# Patient Record
Sex: Male | Born: 1995 | Race: White | Hispanic: No | Marital: Single | Smoking: Never smoker
Health system: Southern US, Community
[De-identification: ages and names within clinical notes are randomized; demographics above are authoritative.]

---
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
## 2016-03-03 IMAGING — CR DG HIP COMPLETE 2+V*L*
1 series · 3 of 3 positions shown · non-contrast
Comparison: None.

CLINICAL DATA: Left hip pain for 4 weeks.  No injury.

EXAM:
LEFT HIP - COMPLETE 2+ VIEW

[Series 1: ap · 0.17mm/px · 3 of 3 slices shown]
[im 1/3]
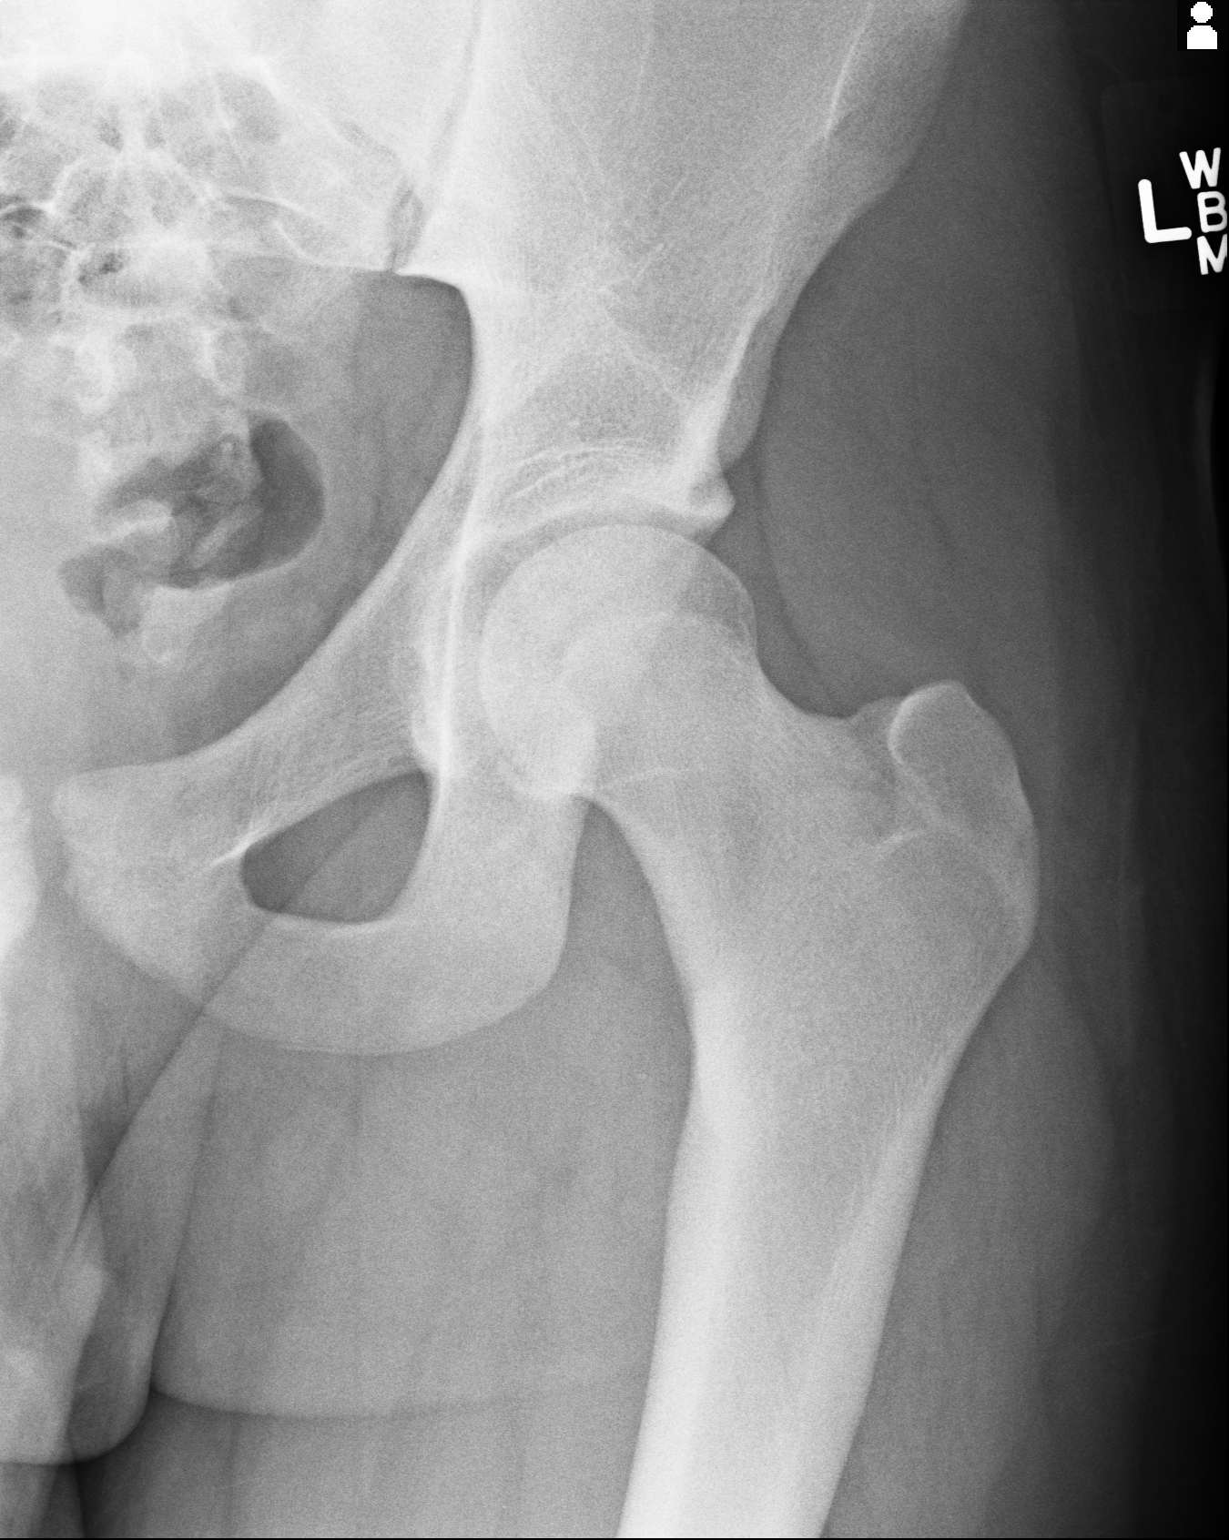
[im 2/3]
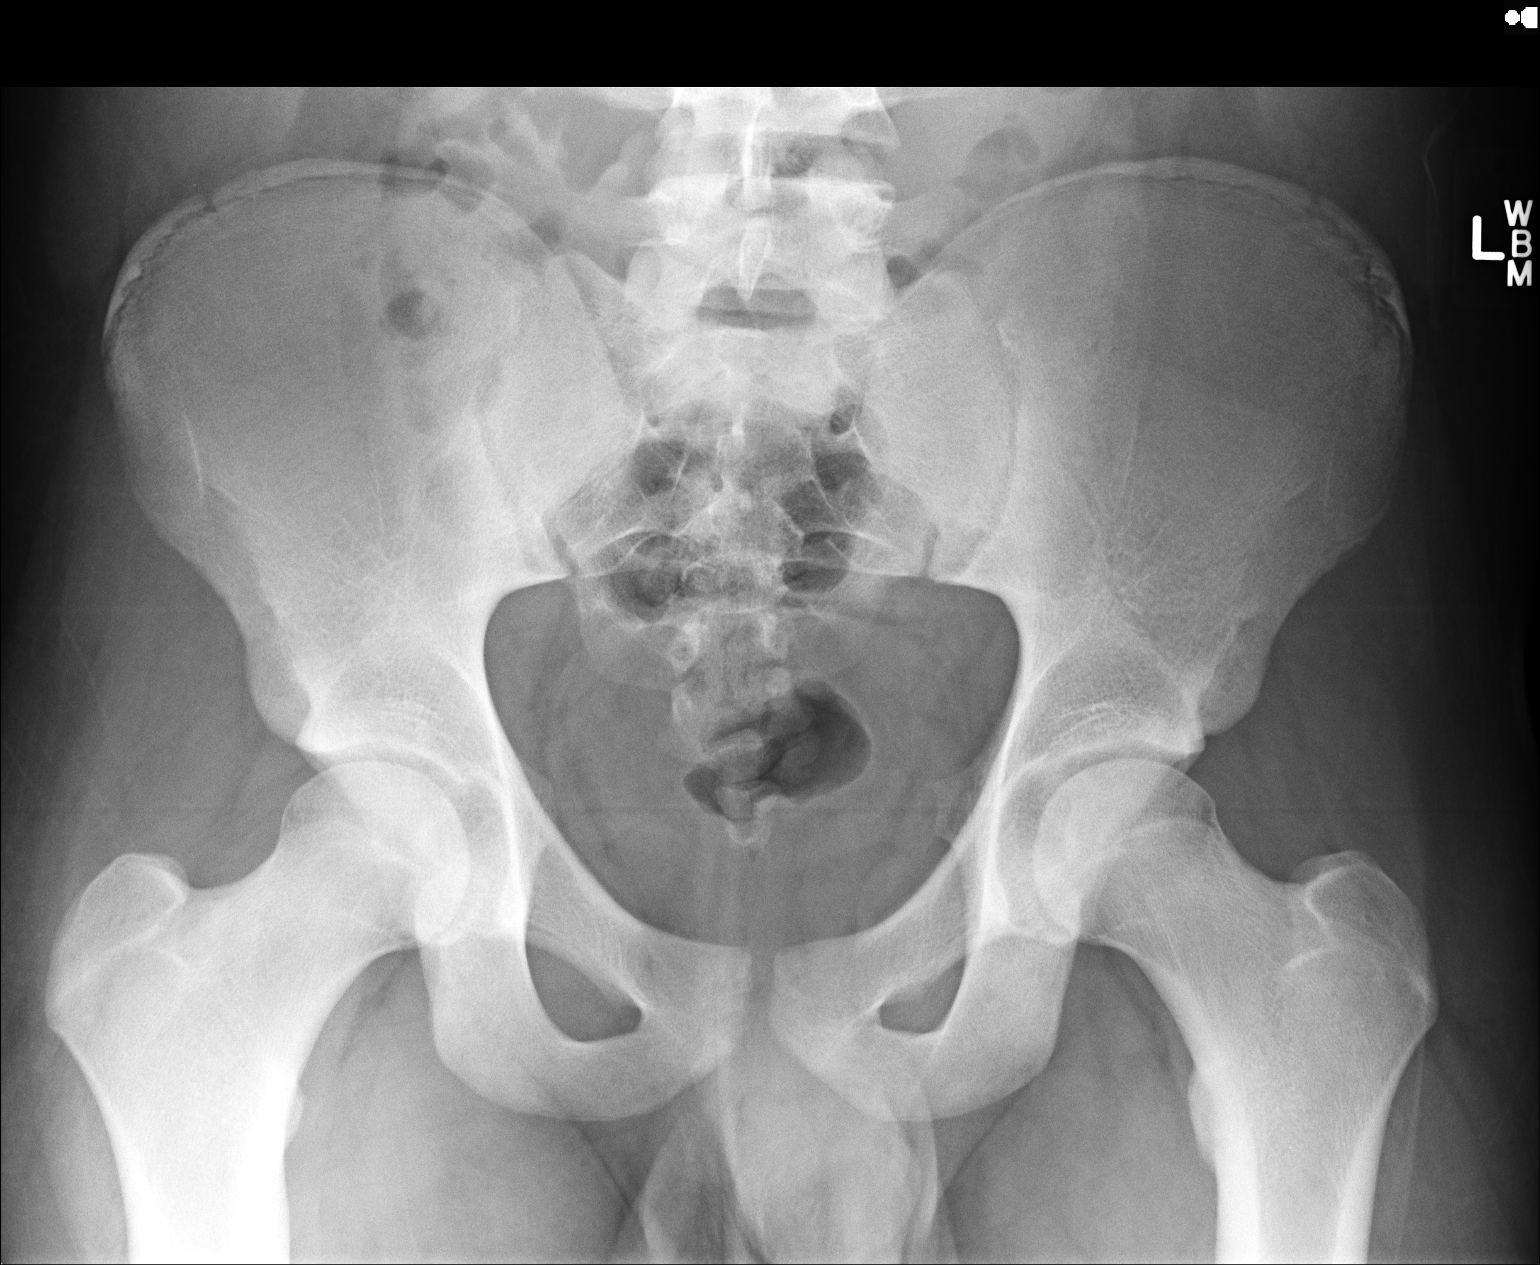
[im 3/3]
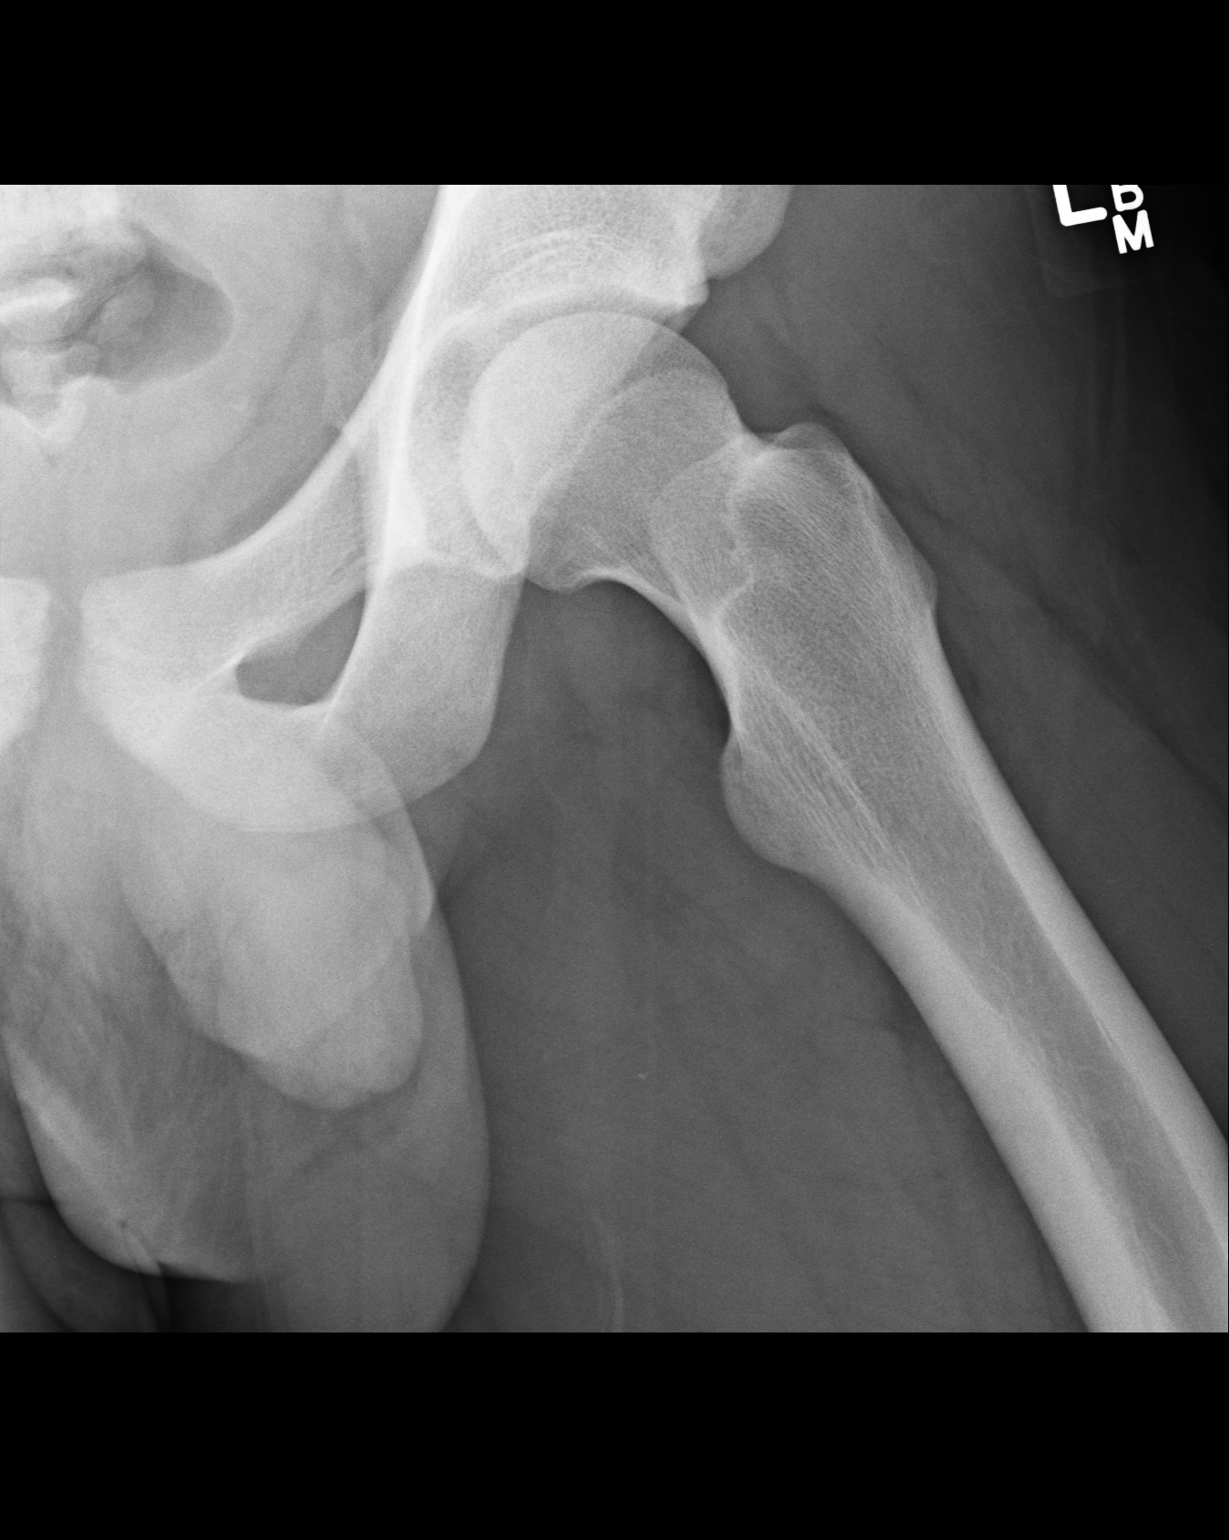

[3 of 3 positions shown; findings below may reference images not displayed]

FINDINGS: There is no evidence of hip fracture or dislocation. There is no
evidence of arthropathy or other focal bone abnormality.
IMPRESSION: Negative.

## 2018-03-21 ENCOUNTER — Emergency Department (HOSPITAL_COMMUNITY)
Admission: EM | Admit: 2018-03-21 | Discharge: 2018-03-21 | Disposition: A | Discharge status: Home / Self Care | Payer: Worker's Compensation | Attending: Emergency Medicine | Admitting: Emergency Medicine

## 2018-03-21 DIAGNOSIS — Y9389 Activity, other specified: Secondary | ICD-10-CM | POA: Diagnosis not present

## 2018-03-21 DIAGNOSIS — Y929 Unspecified place or not applicable: Secondary | ICD-10-CM | POA: Diagnosis not present

## 2018-03-21 DIAGNOSIS — W3189XA Contact with other specified machinery, initial encounter: Secondary | ICD-10-CM | POA: Insufficient documentation

## 2018-03-21 DIAGNOSIS — S51811A Laceration without foreign body of right forearm, initial encounter: Secondary | ICD-10-CM | POA: Diagnosis not present

## 2018-03-21 DIAGNOSIS — S41111A Laceration without foreign body of right upper arm, initial encounter: Secondary | ICD-10-CM | POA: Diagnosis present

## 2018-03-21 DIAGNOSIS — Z23 Encounter for immunization: Secondary | ICD-10-CM | POA: Diagnosis not present

## 2018-03-21 DIAGNOSIS — Y990 Civilian activity done for income or pay: Secondary | ICD-10-CM | POA: Diagnosis not present

## 2018-03-21 MED ORDER — TETANUS-DIPHTH-ACELL PERTUSSIS 5-2.5-18.5 LF-MCG/0.5 IM SUSP
0.5000 mL | Freq: Once | INTRAMUSCULAR | Status: AC
  Administered 2018-03-21: 0.5 mL via INTRAMUSCULAR
  Filled 2018-03-21: qty 0.5

## 2018-03-21 MED ORDER — CEPHALEXIN 500 MG PO CAPS: 500.0000 mg | ORAL_CAPSULE | Freq: Two times a day (BID) | ORAL | 0 refills | Status: AC

## 2018-03-21 MED ORDER — LIDOCAINE-EPINEPHRINE (PF) 2 %-1:200000 IJ SOLN
20.0000 mL | Freq: Once | INTRAMUSCULAR | Status: AC
  Administered 2018-03-21: 20 mL
  Filled 2018-03-21: qty 20

## 2018-03-21 MED ORDER — CEPHALEXIN 250 MG PO CAPS: 500.0000 mg | ORAL_CAPSULE | Freq: Once | ORAL | Status: DC

## 2018-03-21 NOTE — Discharge Instructions (Signed)
Please read attached information. If you experience any new or worsening signs or symptoms please return to the emergency room for evaluation. Please follow-up with your primary care provider or specialist as discussed. Please use medication prescribed only as directed and discontinue taking if you have any concerning signs or symptoms.   °

## 2018-03-21 NOTE — ED Notes (Signed)
Patient verbalizes understanding of discharge instructions. Opportunity for questioning and answers were provided. Armband removed by staff, pt discharged from ED ambulatory.   

## 2018-03-21 NOTE — ED Notes (Signed)
Suture cart and supplies set up at bedside.

## 2018-03-21 NOTE — ED Triage Notes (Signed)
Pt to ER for laceration to right anterior forearm. Bleeding controlled. Unknown last tetanus. Cut on an exhaust.

## 2018-03-21 NOTE — ED Provider Notes (Signed)
[facility] Provider Note   CSN: [id number] Arrival date & time: 03/21/18  1648     History   Chief Complaint Chief Complaint  Patient presents with  . Extremity Laceration    HPI Steve Donaldson is a 22 y.o. male.  HPI  22 year old male presents today with laceration to his right upper extremity.  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  He notes he cut his right upper extremity on a muffler.  Bleeding was controlled with direct pressure.  He denies any loss of distal sensation strength and motor function to his hands fingers or wrist.  Patient is uncertain of his last tetanus shot.  No significant medical history, no allergies or medications prior to arrival.  No past medical history on file.  Patient Active Problem List   Diagnosis Date Noted  . Paronychia 03/31/2015    No past surgical history on file.      Home Medications    Prior to Admission medications   Medication Sig Start Date End Date Taking? Authorizing Provider  cephALEXin (KEFLEX) 500 MG capsule Take 1 capsule (500 mg total) by mouth 2 (two) times daily. 03/21/18   Steve Donaldson, Tinnie GensJeffrey, PA-C  ibuprofen (ADVIL,MOTRIN) 800 MG tablet  09/29/14   [provider]  sulfamethoxazole-trimethoprim (BACTRIM DS,SEPTRA DS) 800-160 MG per tablet  10/12/14   [provider]    Family History No family history on file.  Social History Social History   Tobacco Use  . Smoking status: Never Smoker  Substance Use Topics  . Alcohol use: No    Alcohol/week: 0.0 oz  . Drug use: Not on file     Allergies   Patient has no known allergies.   Review of Systems Review of Systems   Physical Exam Updated Vital Signs BP (!) 153/93 (BP Location: Left Arm)   Pulse 72   Temp 98.9 F (37.2 C) (Oral)   Resp 18   SpO2 100%   Physical Exam   ED Treatments / Results  Labs (all labs ordered are listed, but only abnormal results are  displayed) Labs Reviewed - No data to display  EKG None  Radiology No results found.  Procedures .Marland Kitchen.Laceration Repair Date/Time: 03/21/2018 8:26 PM Performed by: Eyvonne MechanicHedges, Steve Ostrand, PA-C Authorized by: Eyvonne MechanicHedges, Steve Bolz, PA-C   Consent:    Consent obtained:  Verbal   Consent given by:  Patient   Risks discussed:  Infection, need for additional repair, pain, poor cosmetic result, retained foreign body and vascular damage   Alternatives discussed:  No treatment Anesthesia (see MAR for exact dosages):    Anesthesia method:  Local infiltration   Local anesthetic:  Lidocaine 2% WITH epi Laceration details:    Location: right upper extremity    Length (cm):  4 Repair type:    Repair type:  Simple Pre-procedure details:    Preparation:  Patient was prepped and draped in usual sterile fashion Exploration:    Hemostasis achieved with:  Direct pressure   Wound exploration: wound explored through full range of motion and entire depth of wound probed and visualized     Wound extent: no fascia violation noted, no foreign bodies/material noted, no muscle damage noted, no nerve damage noted, no tendon damage noted, no underlying fracture noted and no vascular damage noted     Contaminated: no   Treatment:    Area cleansed with:  Betadine and saline   Amount of cleaning:  Extensive   Irrigation  solution:  Sterile water and sterile saline   Irrigation volume:  1 liter    Visualized foreign bodies/material removed: no   Skin repair:    Repair method:  Sutures   Suture size:  3-0   Suture material:  Prolene   Suture technique:  Simple interrupted   Number of sutures:  8 Approximation:    Approximation:  Close Post-procedure details:    Dressing:  Antibiotic ointment   Patient tolerance of procedure:  Tolerated well, no immediate complications   (including critical care time)  Medications Ordered in ED Medications  cephALEXin (KEFLEX) capsule 500 mg (has no administration in time range)   lidocaine-EPINEPHrine (XYLOCAINE W/EPI) 2 %-1:200000 (PF) injection 20 mL (20 mLs Infiltration Given by Other 03/21/18 1907)  Tdap (BOOSTRIX) injection 0.5 mL (0.5 mLs Intramuscular Given 03/21/18 1907)     Initial Impression / Assessment and Plan / ED Course  I have reviewed the triage vital signs and the nursing notes.  Pertinent labs & imaging results that were available during my care of the patient were reviewed by me and considered in my medical decision making (see chart for details).     Final Clinical Impressions(s) / ED Diagnoses   Final diagnoses:  Laceration of right forearm, initial encounter    Labs:   Imaging:  Consults:  Therapeutics: tdap lidocaine, keflex  Discharge Meds: keflex   Assessment/Plan: 22 year old male presents today with laceration to his right upper extremity.  He has no signs of major vascular or muscle involvement.  Wound was thoroughly irrigated and closed here.  Patient placed on 3 days of prophylactic antibiotics.  Patient will return in 7-10 days for suture removal sooner as needed for any new or worsening signs or symptoms.  Patient verbalized understanding and agreement to this point had no further questions or concerns from discharge.      ED Discharge Orders        Ordered    cephALEXin (KEFLEX) 500 MG capsule  2 times daily     03/21/18 2023       Eyvonne Mechanic, Cordelia Poche 03/21/18 2027    Mancel Bale, MD 03/22/18 1511

## 2018-05-23 ENCOUNTER — Other Ambulatory Visit: Payer: Self-pay

## 2018-05-23 ENCOUNTER — Emergency Department (HOSPITAL_COMMUNITY): Payer: BLUE CROSS/BLUE SHIELD

## 2018-05-23 ENCOUNTER — Emergency Department (HOSPITAL_COMMUNITY)
Admission: EM | Admit: 2018-05-23 | Discharge: 2018-05-23 | Disposition: A | Discharge status: Home / Self Care | Payer: BLUE CROSS/BLUE SHIELD | Attending: Emergency Medicine | Admitting: Emergency Medicine

## 2018-05-23 ENCOUNTER — Encounter (HOSPITAL_COMMUNITY): Payer: Self-pay

## 2018-05-23 DIAGNOSIS — Y92310 Basketball court as the place of occurrence of the external cause: Secondary | ICD-10-CM | POA: Insufficient documentation

## 2018-05-23 DIAGNOSIS — Y999 Unspecified external cause status: Secondary | ICD-10-CM | POA: Diagnosis not present

## 2018-05-23 DIAGNOSIS — Y9367 Activity, basketball: Secondary | ICD-10-CM | POA: Diagnosis not present

## 2018-05-23 DIAGNOSIS — S93401A Sprain of unspecified ligament of right ankle, initial encounter: Secondary | ICD-10-CM | POA: Insufficient documentation

## 2018-05-23 DIAGNOSIS — X500XXA Overexertion from strenuous movement or load, initial encounter: Secondary | ICD-10-CM | POA: Insufficient documentation

## 2018-05-23 DIAGNOSIS — S99911A Unspecified injury of right ankle, initial encounter: Secondary | ICD-10-CM | POA: Diagnosis present

## 2018-05-23 NOTE — ED Notes (Signed)
ED Provider at bedside. 

## 2018-05-23 NOTE — ED Triage Notes (Addendum)
Patient states that he injured his right ankle while playing basketball.  Rolled my ankle and it started swelling.  Patient has iced his ankle.  Took 400 mg of Ibuprofen prior to arrival here.  Was not able to bear weight.

## 2018-05-23 NOTE — ED Provider Notes (Signed)
[facility] EMERGENCY DEPARTMENT Provider Note   CSN: [id number] Arrival date & time: 05/23/18  2215     History   Chief Complaint Chief Complaint  Patient presents with  . Ankle Pain    HPI Steve Donaldson is a 22 y.o. male.  HPI   Steve Donaldson is a 22 y.o. male who presents to the Emergency Department complaining of right ankle pain and swelling.  He was playing basketball this evening when he "rolled" his ankle. Reports immediate swelling and felt a "pop" to his outer ankle.  Unable to bear weight.  Pain worse with movement, improves at rest and with application of ice.  Took 800 mg ibuprofen just prior to arrival.  Denies numbness of his foot, calf or knee pain or other injuries.   History reviewed. No pertinent past medical history.  Patient Active Problem List   Diagnosis Date Noted  . Paronychia 03/31/2015    History reviewed. No pertinent surgical history.    Home Medications    Prior to Admission medications   Medication Sig Start Date End Date Taking? Authorizing Provider  cephALEXin (KEFLEX) 500 MG capsule Take 1 capsule (500 mg total) by mouth 2 (two) times daily. 03/21/18   Hedges, Tinnie GensJeffrey, PA-C  ibuprofen (ADVIL,MOTRIN) 800 MG tablet  09/29/14   [provider]  sulfamethoxazole-trimethoprim (BACTRIM DS,SEPTRA DS) 800-160 MG per tablet  10/12/14   [provider]    Family History History reviewed. No pertinent family history.  Social History Social History   Tobacco Use  . Smoking status: Never Smoker  . Smokeless tobacco: Never Used  Substance Use Topics  . Alcohol use: No    Alcohol/week: 0.0 oz  . Drug use: Never     Allergies   Patient has no known allergies.   Review of Systems Review of Systems  Constitutional: Negative for chills and fever.  Musculoskeletal: Positive for arthralgias (right ankle pain and swelling) and joint swelling.  Skin: Negative for color change and wound.  Neurological: Negative for  weakness and numbness.  All other systems reviewed and are negative.    Physical Exam Updated Vital Signs BP (!) 154/104 (BP Location: Right Arm)   Pulse (!) 101   Temp 98.9 F (37.2 C) (Oral)   Resp 16   Ht 6\' 1"  (1.854 m)   Wt 113.4 kg (250 lb)   SpO2 99%   BMI 32.98 kg/m   Physical Exam  Constitutional: He appears well-developed and well-nourished. No distress.  HENT:  Head: Atraumatic.  Cardiovascular: Normal rate, regular rhythm and intact distal pulses.  Pulmonary/Chest: Effort normal and breath sounds normal. No respiratory distress.  Musculoskeletal: He exhibits edema and tenderness. He exhibits no deformity.  Moderate edema of the lateral right ankle. Pain reproduced with inversion of the ankle.  No bony deformity noted.  No proximal tenderness or edema.  No open wounds.   Neurological: He is alert. No sensory deficit. He exhibits normal muscle tone. Coordination normal.  Skin: Skin is warm and dry. Capillary refill takes less than 2 seconds.  Nursing note and vitals reviewed.    ED Treatments / Results  Labs (all labs ordered are listed, but only abnormal results are displayed) Labs Reviewed - No data to display  EKG None  Radiology Dg Ankle Complete Right  Result Date: 05/23/2018 CLINICAL DATA:  Basketball injury.  Pain and swelling. EXAM: RIGHT ANKLE - COMPLETE 3+ VIEW COMPARISON:  None. FINDINGS: There is no evidence of acute fracture joint dislocation.  There is no evidence of arthropathy or other focal bone abnormality. Marked soft tissue swelling is seen about the malleoli more so laterally. No widening of the medial clear space of the ankle. Mild soft tissue induration is seen dorsal to the ankle joint. Small joint effusion is noted. Well corticated rounded ossific density is seen across the talonavicular joint likely related to old remote injury. The base of the fifth metatarsal appears intact. IMPRESSION: Soft tissue swelling about the malleoli more so  laterally. Small ankle joint effusion. No acute osseous abnormality is identified. Electronically Signed   By: Tollie Eth M.D.   On: 05/23/2018 22:58    Procedures Procedures (including critical care time)  Medications Ordered in ED Medications - No data to display   Initial Impression / Assessment and Plan / ED Course  I have reviewed the triage vital signs and the nursing notes.  Pertinent labs & imaging results that were available during my care of the patient were reviewed by me and considered in my medical decision making (see chart for details).     XR neg for fx.  Discussed findings with patient.   NV intact.  Likely sprain.  ASO applied and pt given crutches.  He agrees to RICE therapy and close orthopedic f/u in one week if not improving.  Ibuprofen for pain  Final Clinical Impressions(s) / ED Diagnoses   Final diagnoses:  Sprain of right ankle, unspecified ligament, initial encounter    ED Discharge Orders    None       Rosey Bath 05/23/18 2314    Linwood Dibbles, MD 05/23/18 2351

## 2018-05-23 NOTE — Discharge Instructions (Addendum)
Elevate and apply ice packs on and off to your ankle.  Use crutches for weightbearing for 1 week.  Follow-up with the orthopedic provider listed in 1 week if your symptoms are not improving.  Ibuprofen 600 to 800 mg every 6-8 hours, take with food.

## 2019-02-20 IMAGING — DX DG ANKLE COMPLETE 3+V*R*
3 series · 3 of 3 positions shown · non-contrast
Comparison: None.

CLINICAL DATA: Basketball injury.  Pain and swelling.

EXAM:
RIGHT ANKLE - COMPLETE 3+ VIEW

[ankle ap]
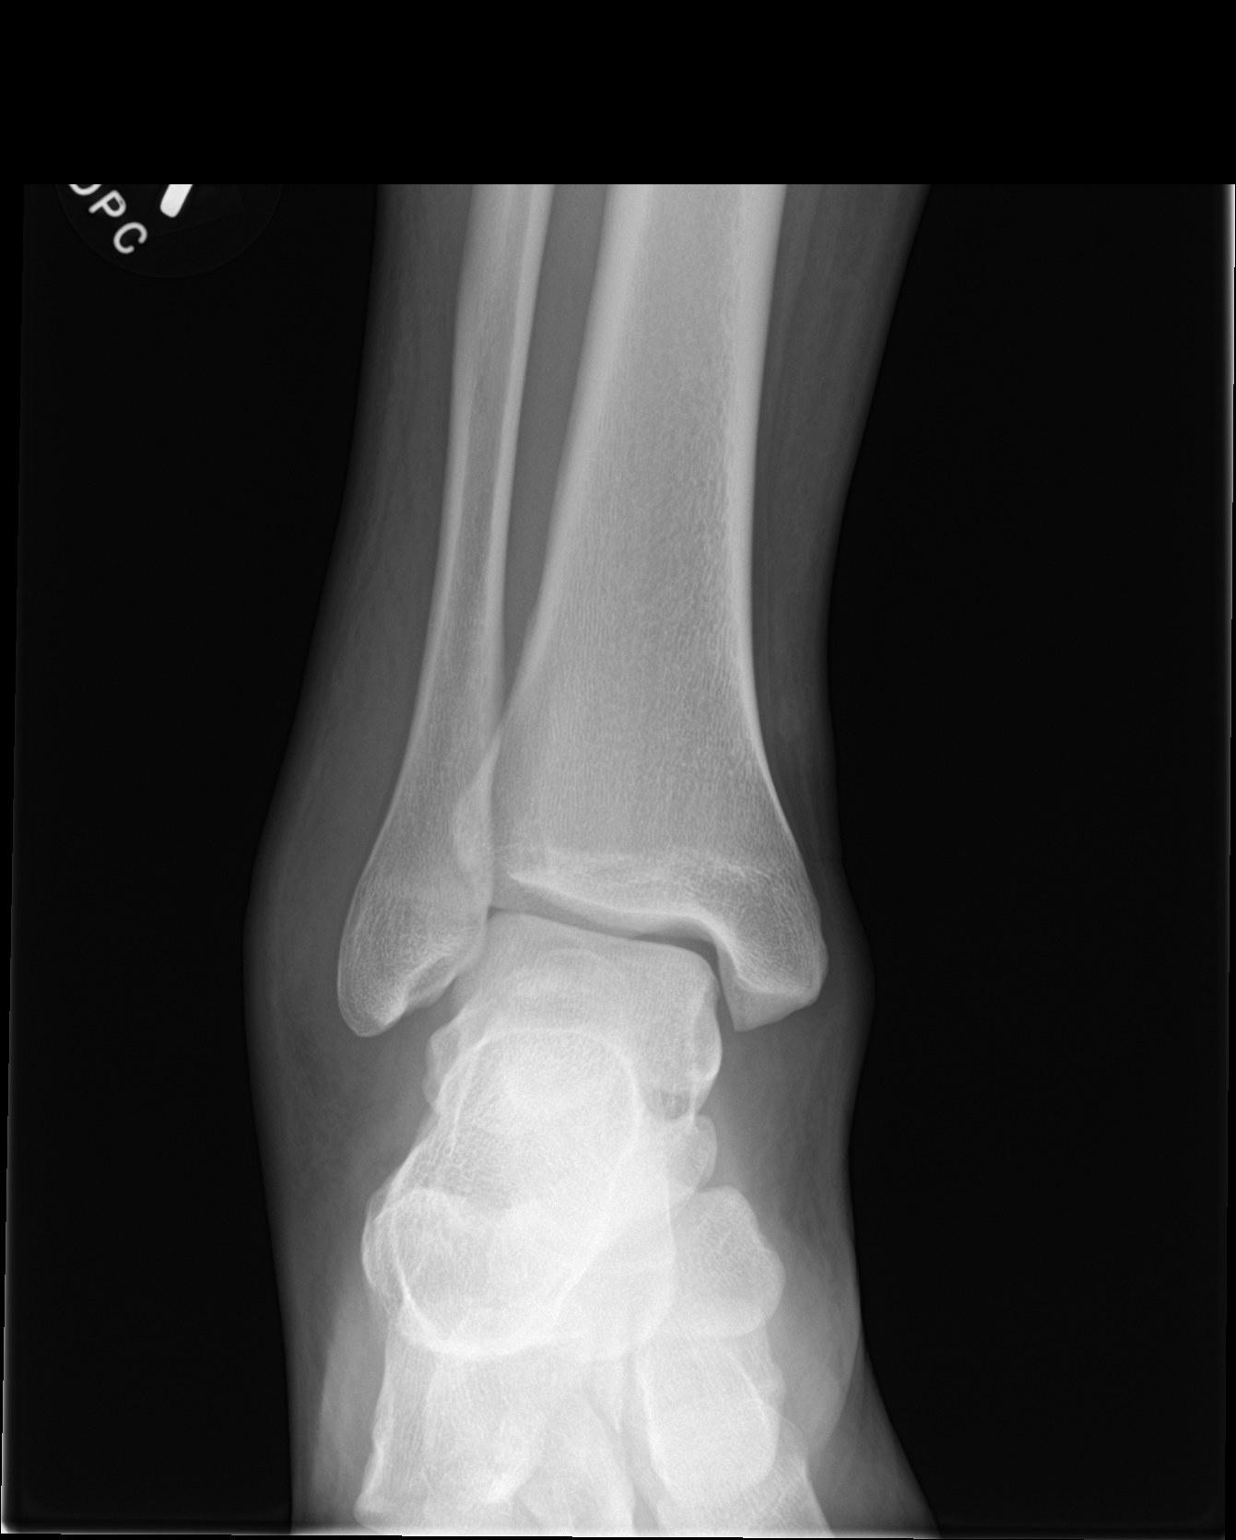

[ankle obl]
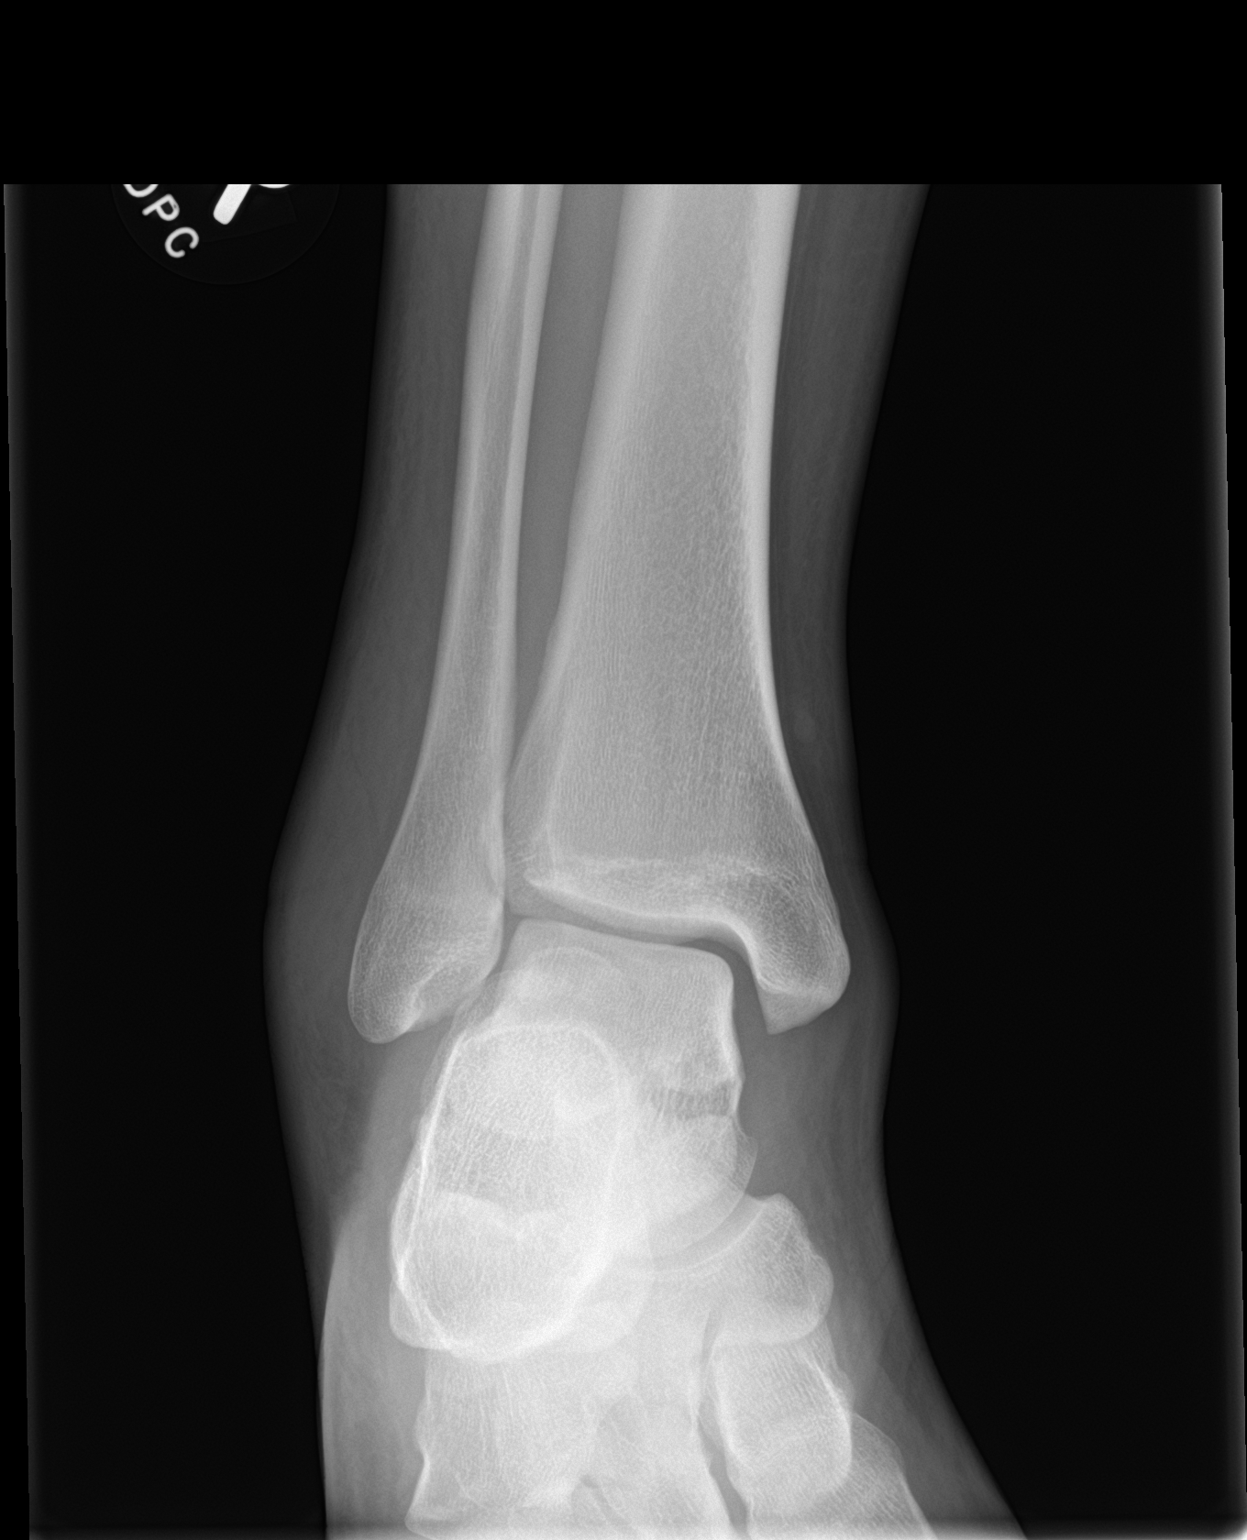

[ankle lat]
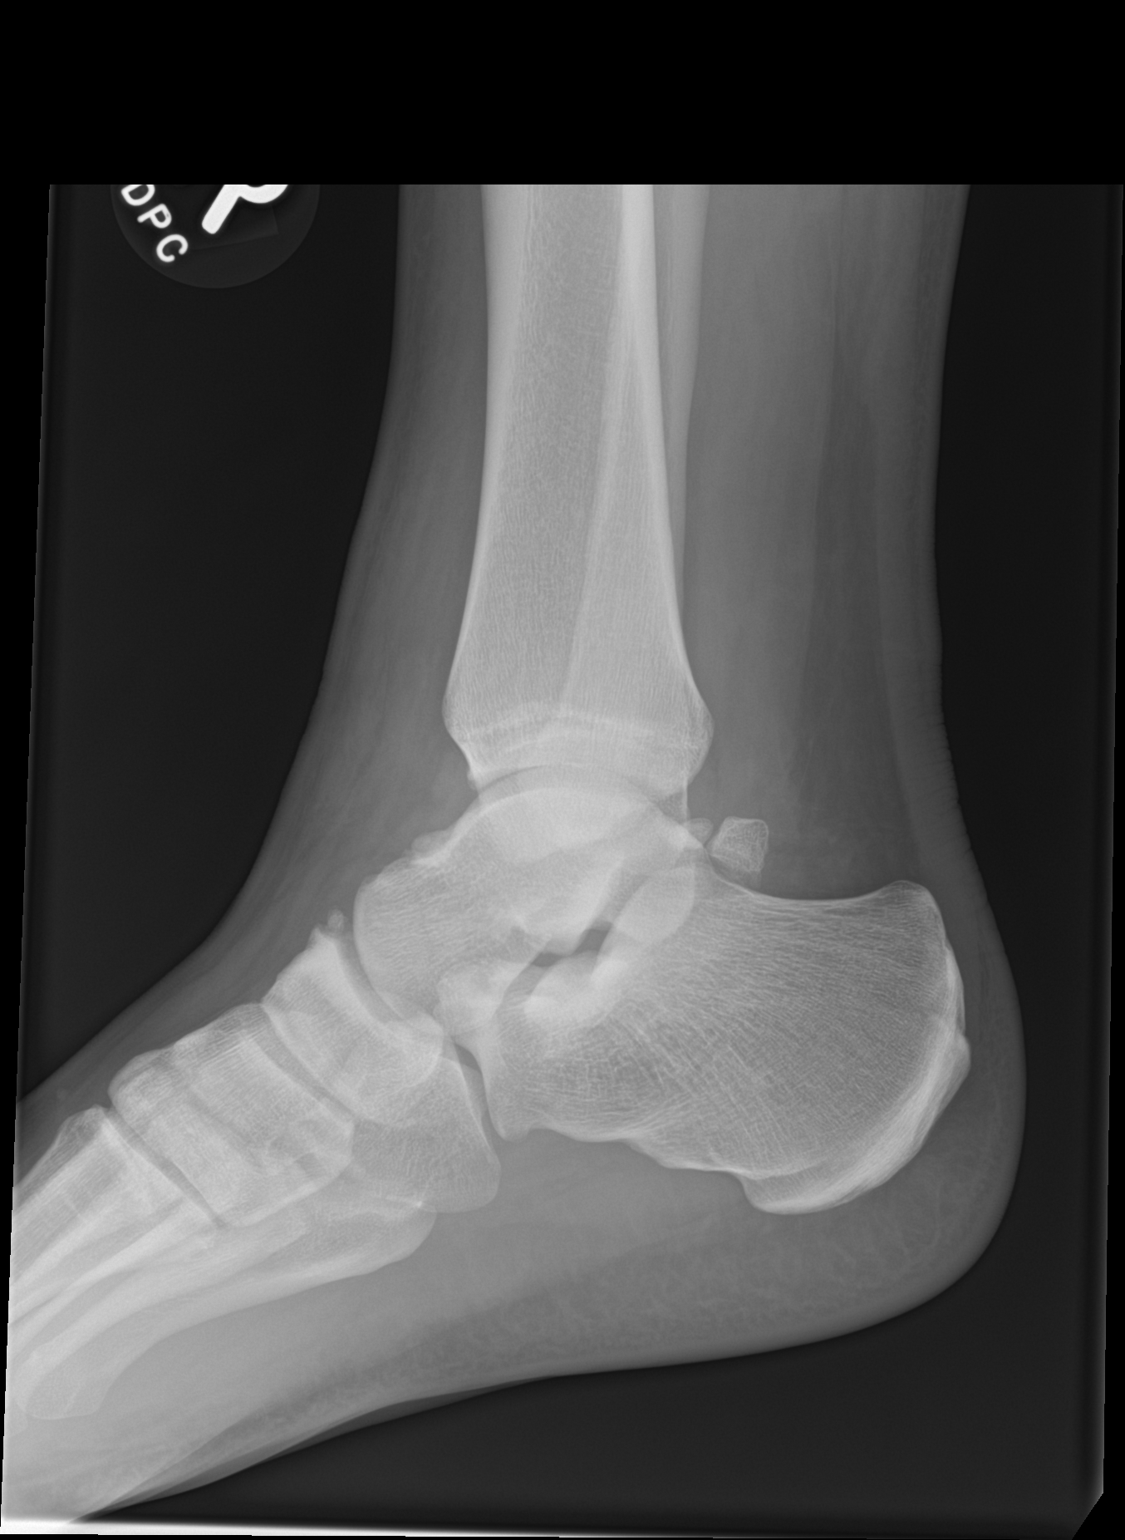

[3 of 3 positions shown; findings below may reference images not displayed]

FINDINGS: There is no evidence of acute fracture joint dislocation. There is
no evidence of arthropathy or other focal bone abnormality. Marked
soft tissue swelling is seen about the malleoli more so laterally.
No widening of the medial clear space of the ankle. Mild soft tissue
induration is seen dorsal to the ankle joint. Small joint effusion
is noted. Well corticated rounded ossific density is seen across the
talonavicular joint likely related to old remote injury. The base of
the fifth metatarsal appears intact.
IMPRESSION: Soft tissue swelling about the malleoli more so laterally. Small
ankle joint effusion. No acute osseous abnormality is identified.
